# Patient Record
Sex: Female | Born: 1935 | Race: White | Hispanic: No
Health system: Southern US, Community
[De-identification: ages and names within clinical notes are randomized; demographics above are authoritative.]

---

## 2004-07-19 ENCOUNTER — Ambulatory Visit: Payer: Self-pay | Admitting: Gastroenterology

## 2004-08-09 ENCOUNTER — Emergency Department: Payer: Self-pay | Admitting: Emergency Medicine

## 2004-12-03 ENCOUNTER — Emergency Department: Payer: Self-pay | Admitting: General Practice

## 2004-12-28 ENCOUNTER — Ambulatory Visit: Payer: Self-pay | Admitting: Unknown Physician Specialty

## 2005-10-30 ENCOUNTER — Emergency Department: Payer: Self-pay | Admitting: Internal Medicine

## 2005-10-30 ENCOUNTER — Other Ambulatory Visit: Payer: Self-pay

## 2005-11-01 ENCOUNTER — Emergency Department: Payer: Self-pay | Admitting: Emergency Medicine

## 2006-01-05 ENCOUNTER — Ambulatory Visit: Payer: Self-pay | Admitting: Unknown Physician Specialty

## 2006-03-06 ENCOUNTER — Ambulatory Visit: Payer: Self-pay | Admitting: Unknown Physician Specialty

## 2007-02-14 ENCOUNTER — Ambulatory Visit: Payer: Self-pay | Admitting: Unknown Physician Specialty

## 2007-03-07 ENCOUNTER — Ambulatory Visit: Payer: Self-pay | Admitting: Unknown Physician Specialty

## 2007-04-02 ENCOUNTER — Inpatient Hospital Stay: Payer: Self-pay | Admitting: Specialist

## 2007-04-02 ENCOUNTER — Other Ambulatory Visit: Payer: Self-pay

## 2007-07-22 IMAGING — CR PELVIS - 1-2 VIEW
1 series · 1 of 1 positions shown · non-contrast
Comparison: none

REASON FOR EXAM: Fall. Rm 5
COMMENTS:

PROCEDURE:     DXR - DXR PELVIS AP ONLY  - October 30, 2005  [DATE]
RESULT:     AP view of the bony pelvis shows no fracture or other acute bony
abnormality.  There is narrowing of the L4-L5 intervertebral disk space
compatible with disk disease.

[view not recorded]
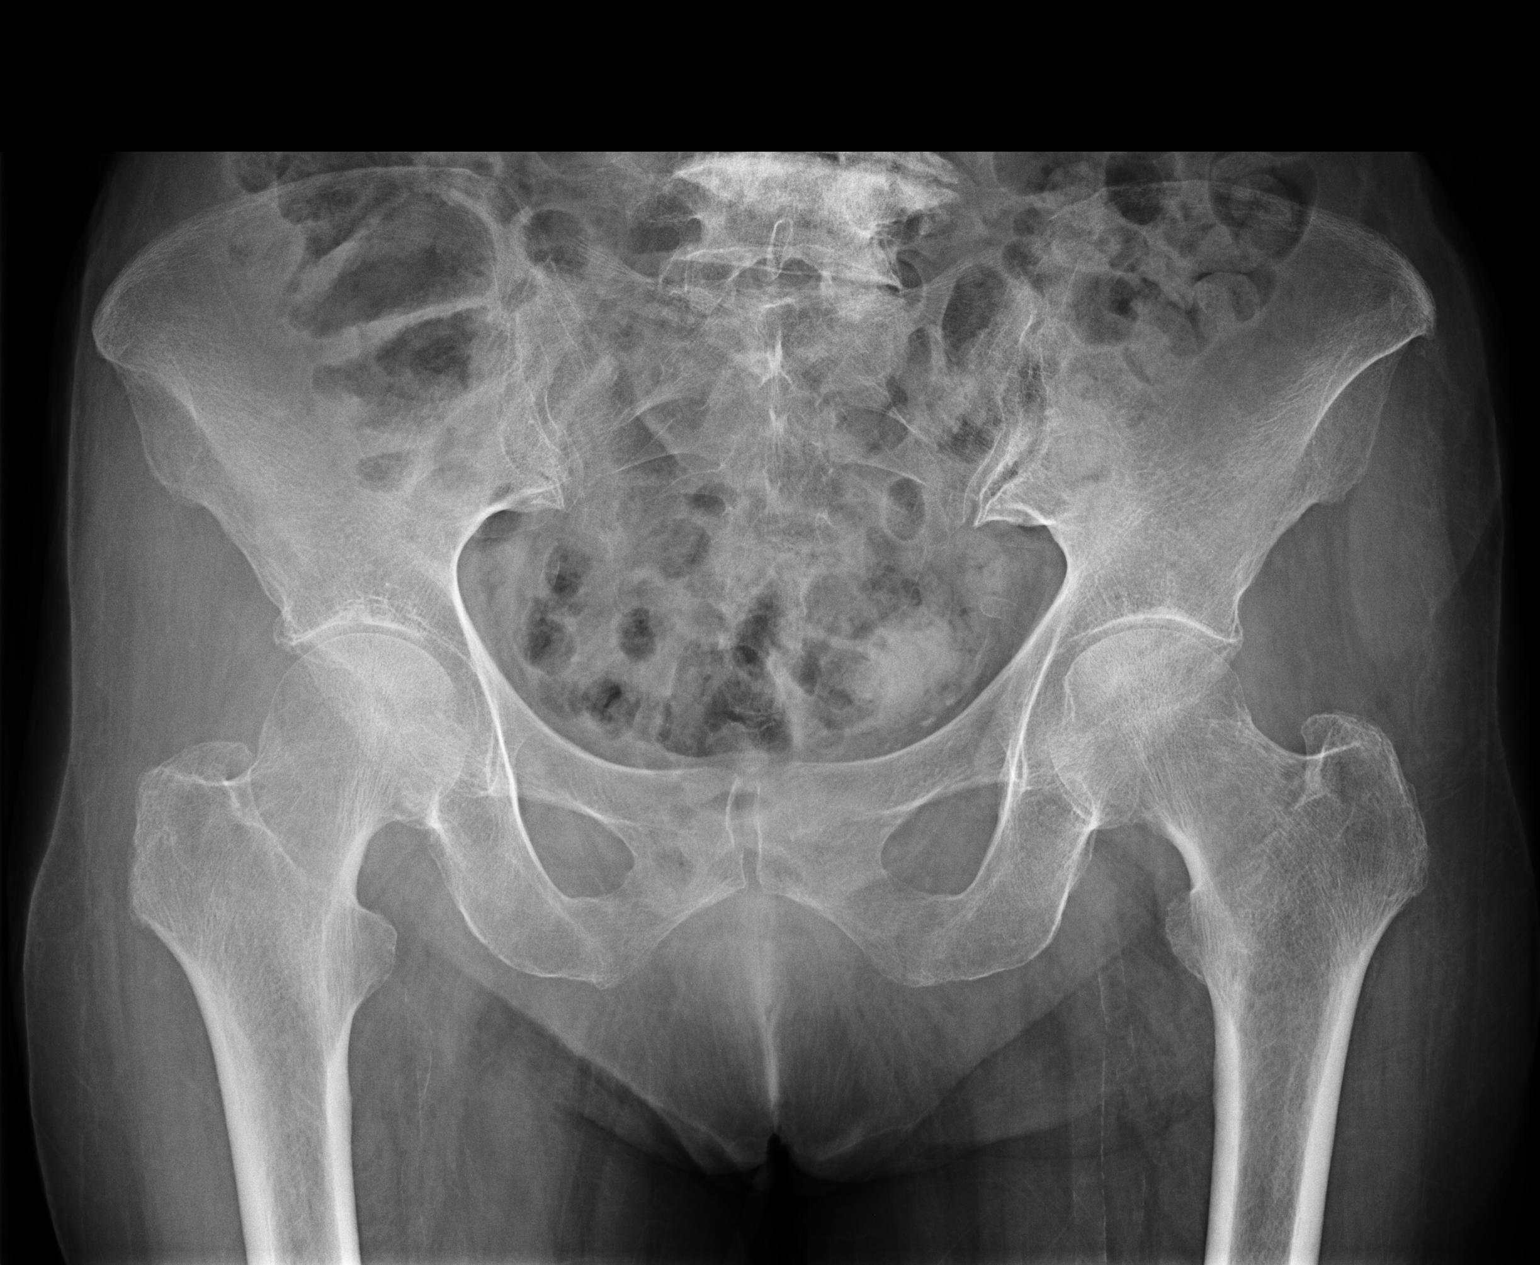

[1 of 1 positions shown; findings below may reference images not displayed]

IMPRESSION: No fracture is seen.

There are changes of degenerative disk disease of the lower lumbar spine.

## 2009-05-05 ENCOUNTER — Ambulatory Visit: Payer: Self-pay | Admitting: Gastroenterology

## 2012-10-12 ENCOUNTER — Observation Stay: Payer: Self-pay | Admitting: Internal Medicine

## 2012-10-12 LAB — CBC
HGB: 12.7 g/dL (ref 12.0–16.0)
Platelet: 198 10*3/uL (ref 150–440)
RBC: 3.91 10*6/uL (ref 3.80–5.20)
RDW: 15.4 % — ABNORMAL HIGH (ref 11.5–14.5)
WBC: 7 10*3/uL (ref 3.6–11.0)

## 2012-10-12 LAB — URINALYSIS, COMPLETE
Bilirubin,UR: NEGATIVE
Glucose,UR: NEGATIVE mg/dL (ref 0–75)
Nitrite: NEGATIVE
Protein: NEGATIVE
RBC,UR: 1 /HPF (ref 0–5)
Squamous Epithelial: <1

## 2012-10-12 LAB — COMPREHENSIVE METABOLIC PANEL
Albumin: 3.8 g/dL (ref 3.4–5.0)
BUN: 30 mg/dL — ABNORMAL HIGH (ref 7–18)
Calcium, Total: 9.3 mg/dL (ref 8.5–10.1)
Co2: 32 mmol/L (ref 21–32)
Sodium: 139 mmol/L (ref 136–145)

## 2012-10-13 LAB — CBC WITH DIFFERENTIAL/PLATELET
Basophil %: 1 %
HCT: 36.3 % (ref 35.0–47.0)
HGB: 12.3 g/dL (ref 12.0–16.0)
Monocyte #: 0.7 x10 3/mm (ref 0.2–0.9)
Neutrophil %: 56.8 %
WBC: 6.9 10*3/uL (ref 3.6–11.0)

## 2012-10-13 LAB — CK TOTAL AND CKMB (NOT AT ARMC)
CK, Total: 43 U/L (ref 21–215)
CK, Total: 42 U/L (ref 21–215)

## 2012-10-13 LAB — BASIC METABOLIC PANEL
BUN: 24 mg/dL — ABNORMAL HIGH (ref 7–18)
Calcium, Total: 9.2 mg/dL (ref 8.5–10.1)
Glucose: 110 mg/dL — ABNORMAL HIGH (ref 65–99)

## 2012-10-13 LAB — TROPONIN I
Troponin-I: 0.02 ng/mL
Troponin-I: 0.04 ng/mL

## 2012-10-19 DIAGNOSIS — N189 Chronic kidney disease, unspecified: Secondary | ICD-10-CM

## 2012-10-19 DIAGNOSIS — E785 Hyperlipidemia, unspecified: Secondary | ICD-10-CM

## 2012-10-19 DIAGNOSIS — I129 Hypertensive chronic kidney disease with stage 1 through stage 4 chronic kidney disease, or unspecified chronic kidney disease: Secondary | ICD-10-CM

## 2012-10-19 DIAGNOSIS — M6281 Muscle weakness (generalized): Secondary | ICD-10-CM

## 2012-10-19 DIAGNOSIS — E039 Hypothyroidism, unspecified: Secondary | ICD-10-CM

## 2012-11-01 LAB — LIPID PANEL: Cholesterol: 130 mg/dL (ref 0–200)

## 2012-11-14 ENCOUNTER — Encounter: Payer: Self-pay | Admitting: Family Medicine

## 2012-11-14 DIAGNOSIS — N189 Chronic kidney disease, unspecified: Secondary | ICD-10-CM

## 2012-11-14 DIAGNOSIS — E039 Hypothyroidism, unspecified: Secondary | ICD-10-CM

## 2012-11-14 DIAGNOSIS — F329 Major depressive disorder, single episode, unspecified: Secondary | ICD-10-CM

## 2012-11-14 DIAGNOSIS — Z66 Do not resuscitate: Secondary | ICD-10-CM | POA: Insufficient documentation

## 2012-11-14 DIAGNOSIS — G4700 Insomnia, unspecified: Secondary | ICD-10-CM

## 2012-11-30 DIAGNOSIS — I129 Hypertensive chronic kidney disease with stage 1 through stage 4 chronic kidney disease, or unspecified chronic kidney disease: Secondary | ICD-10-CM

## 2012-11-30 DIAGNOSIS — G4700 Insomnia, unspecified: Secondary | ICD-10-CM

## 2012-11-30 DIAGNOSIS — N189 Chronic kidney disease, unspecified: Secondary | ICD-10-CM

## 2012-11-30 DIAGNOSIS — E785 Hyperlipidemia, unspecified: Secondary | ICD-10-CM

## 2012-11-30 DIAGNOSIS — M810 Age-related osteoporosis without current pathological fracture: Secondary | ICD-10-CM

## 2013-01-02 ENCOUNTER — Ambulatory Visit: Payer: Self-pay | Admitting: Family Medicine

## 2013-01-07 DIAGNOSIS — L570 Actinic keratosis: Secondary | ICD-10-CM

## 2013-01-11 DIAGNOSIS — G4700 Insomnia, unspecified: Secondary | ICD-10-CM

## 2013-01-11 DIAGNOSIS — K5900 Constipation, unspecified: Secondary | ICD-10-CM

## 2013-01-11 DIAGNOSIS — M810 Age-related osteoporosis without current pathological fracture: Secondary | ICD-10-CM

## 2013-01-11 DIAGNOSIS — N189 Chronic kidney disease, unspecified: Secondary | ICD-10-CM

## 2013-01-30 DIAGNOSIS — H9209 Otalgia, unspecified ear: Secondary | ICD-10-CM

## 2013-02-20 DIAGNOSIS — H6120 Impacted cerumen, unspecified ear: Secondary | ICD-10-CM

## 2013-03-04 DIAGNOSIS — L02419 Cutaneous abscess of limb, unspecified: Secondary | ICD-10-CM

## 2013-03-04 DIAGNOSIS — L03119 Cellulitis of unspecified part of limb: Secondary | ICD-10-CM

## 2013-04-02 DIAGNOSIS — I872 Venous insufficiency (chronic) (peripheral): Secondary | ICD-10-CM

## 2013-05-15 DIAGNOSIS — F3289 Other specified depressive episodes: Secondary | ICD-10-CM

## 2013-05-15 DIAGNOSIS — G4700 Insomnia, unspecified: Secondary | ICD-10-CM

## 2013-05-15 DIAGNOSIS — F411 Generalized anxiety disorder: Secondary | ICD-10-CM

## 2013-05-15 DIAGNOSIS — E782 Mixed hyperlipidemia: Secondary | ICD-10-CM

## 2013-05-15 DIAGNOSIS — I872 Venous insufficiency (chronic) (peripheral): Secondary | ICD-10-CM

## 2013-05-15 DIAGNOSIS — I10 Essential (primary) hypertension: Secondary | ICD-10-CM

## 2013-05-15 DIAGNOSIS — F329 Major depressive disorder, single episode, unspecified: Secondary | ICD-10-CM

## 2013-05-20 DIAGNOSIS — L03119 Cellulitis of unspecified part of limb: Secondary | ICD-10-CM

## 2013-05-20 DIAGNOSIS — L02419 Cutaneous abscess of limb, unspecified: Secondary | ICD-10-CM

## 2013-06-19 DIAGNOSIS — D1779 Benign lipomatous neoplasm of other sites: Secondary | ICD-10-CM

## 2013-06-19 DIAGNOSIS — M25519 Pain in unspecified shoulder: Secondary | ICD-10-CM

## 2013-07-17 DIAGNOSIS — E876 Hypokalemia: Secondary | ICD-10-CM

## 2013-07-17 DIAGNOSIS — E039 Hypothyroidism, unspecified: Secondary | ICD-10-CM

## 2013-07-17 DIAGNOSIS — E785 Hyperlipidemia, unspecified: Secondary | ICD-10-CM

## 2013-07-17 DIAGNOSIS — N189 Chronic kidney disease, unspecified: Secondary | ICD-10-CM

## 2013-07-17 DIAGNOSIS — I129 Hypertensive chronic kidney disease with stage 1 through stage 4 chronic kidney disease, or unspecified chronic kidney disease: Secondary | ICD-10-CM

## 2013-09-06 DIAGNOSIS — H938X9 Other specified disorders of ear, unspecified ear: Secondary | ICD-10-CM

## 2013-09-17 DIAGNOSIS — E785 Hyperlipidemia, unspecified: Secondary | ICD-10-CM

## 2013-09-17 DIAGNOSIS — I10 Essential (primary) hypertension: Secondary | ICD-10-CM

## 2013-09-17 DIAGNOSIS — F329 Major depressive disorder, single episode, unspecified: Secondary | ICD-10-CM

## 2013-09-17 DIAGNOSIS — G4700 Insomnia, unspecified: Secondary | ICD-10-CM

## 2013-09-17 DIAGNOSIS — M1710 Unilateral primary osteoarthritis, unspecified knee: Secondary | ICD-10-CM

## 2013-09-17 DIAGNOSIS — F411 Generalized anxiety disorder: Secondary | ICD-10-CM

## 2013-09-17 DIAGNOSIS — IMO0002 Reserved for concepts with insufficient information to code with codable children: Secondary | ICD-10-CM

## 2013-09-17 DIAGNOSIS — F3289 Other specified depressive episodes: Secondary | ICD-10-CM

## 2013-11-18 DIAGNOSIS — F329 Major depressive disorder, single episode, unspecified: Secondary | ICD-10-CM

## 2013-11-18 DIAGNOSIS — F3289 Other specified depressive episodes: Secondary | ICD-10-CM

## 2013-11-18 DIAGNOSIS — F0150 Vascular dementia without behavioral disturbance: Secondary | ICD-10-CM

## 2013-11-18 DIAGNOSIS — I10 Essential (primary) hypertension: Secondary | ICD-10-CM

## 2013-11-18 DIAGNOSIS — F411 Generalized anxiety disorder: Secondary | ICD-10-CM

## 2013-11-18 DIAGNOSIS — E785 Hyperlipidemia, unspecified: Secondary | ICD-10-CM

## 2013-11-18 DIAGNOSIS — I872 Venous insufficiency (chronic) (peripheral): Secondary | ICD-10-CM

## 2014-01-17 DIAGNOSIS — I10 Essential (primary) hypertension: Secondary | ICD-10-CM

## 2014-01-17 DIAGNOSIS — G4700 Insomnia, unspecified: Secondary | ICD-10-CM

## 2014-01-17 DIAGNOSIS — F0280 Dementia in other diseases classified elsewhere without behavioral disturbance: Secondary | ICD-10-CM

## 2014-01-17 DIAGNOSIS — G309 Alzheimer's disease, unspecified: Secondary | ICD-10-CM

## 2014-01-17 DIAGNOSIS — E782 Mixed hyperlipidemia: Secondary | ICD-10-CM

## 2014-01-17 DIAGNOSIS — F411 Generalized anxiety disorder: Secondary | ICD-10-CM

## 2014-01-17 DIAGNOSIS — F329 Major depressive disorder, single episode, unspecified: Secondary | ICD-10-CM

## 2014-01-17 DIAGNOSIS — M1710 Unilateral primary osteoarthritis, unspecified knee: Secondary | ICD-10-CM

## 2014-01-17 DIAGNOSIS — I872 Venous insufficiency (chronic) (peripheral): Secondary | ICD-10-CM

## 2014-01-17 DIAGNOSIS — F3289 Other specified depressive episodes: Secondary | ICD-10-CM

## 2014-01-17 DIAGNOSIS — IMO0002 Reserved for concepts with insufficient information to code with codable children: Secondary | ICD-10-CM

## 2014-03-14 DIAGNOSIS — F0150 Vascular dementia without behavioral disturbance: Secondary | ICD-10-CM

## 2014-03-14 DIAGNOSIS — F418 Other specified anxiety disorders: Secondary | ICD-10-CM

## 2014-03-14 DIAGNOSIS — I872 Venous insufficiency (chronic) (peripheral): Secondary | ICD-10-CM

## 2014-03-14 DIAGNOSIS — I10 Essential (primary) hypertension: Secondary | ICD-10-CM

## 2014-05-11 ENCOUNTER — Emergency Department: Payer: Self-pay | Admitting: Internal Medicine

## 2014-05-11 LAB — COMPREHENSIVE METABOLIC PANEL
ALBUMIN: 3 g/dL — AB (ref 3.4–5.0)
ALK PHOS: 121 U/L — AB
AST: 14 U/L — AB (ref 15–37)
Anion Gap: 7 (ref 7–16)
BUN: 43 mg/dL — AB (ref 7–18)
Bilirubin,Total: 0.4 mg/dL (ref 0.2–1.0)
Calcium, Total: 9 mg/dL (ref 8.5–10.1)
Chloride: 103 mmol/L (ref 98–107)
Co2: 32 mmol/L (ref 21–32)
Creatinine: 1.34 mg/dL — ABNORMAL HIGH (ref 0.60–1.30)
EGFR (African American): 47 — ABNORMAL LOW
EGFR (Non-African Amer.): 39 — ABNORMAL LOW
GLUCOSE: 272 mg/dL — AB (ref 65–99)
Osmolality: 304 (ref 275–301)
POTASSIUM: 4 mmol/L (ref 3.5–5.1)
SGPT (ALT): 12 U/L — ABNORMAL LOW
SODIUM: 142 mmol/L (ref 136–145)
TOTAL PROTEIN: 7.1 g/dL (ref 6.4–8.2)

## 2014-05-11 LAB — CBC
HCT: 40.8 % (ref 35.0–47.0)
HGB: 13.4 g/dL (ref 12.0–16.0)
MCH: 32 pg (ref 26.0–34.0)
MCHC: 32.9 g/dL (ref 32.0–36.0)
MCV: 97 fL (ref 80–100)
Platelet: 222 10*3/uL (ref 150–440)
RBC: 4.19 10*6/uL (ref 3.80–5.20)
RDW: 15 % — AB (ref 11.5–14.5)
WBC: 9.3 10*3/uL (ref 3.6–11.0)

## 2014-05-11 LAB — TROPONIN I: TROPONIN-I: 0.02 ng/mL

## 2014-05-12 ENCOUNTER — Telehealth: Payer: Self-pay

## 2014-05-12 DIAGNOSIS — J181 Lobar pneumonia, unspecified organism: Secondary | ICD-10-CM

## 2014-05-12 NOTE — Telephone Encounter (Signed)
PLEASE NOTE: All timestamps contained within this report are represented as [place] Standard Time. CONFIDENTIALTY NOTICE: This fax transmission is intended only for the addressee. It contains information that is legally privileged, confidential or otherwise protected from use or disclosure. If you are not the intended recipient, you are strictly prohibited from reviewing, disclosing, copying using or disseminating any of this information or taking any action in reliance on or regarding this information. If you have received this fax in error, please notify us immediately by telephone so that we can arrange for its return to [place]. 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 Patient has had a cough for a few days and yesterday and this morning she has a low grade temp. She has some wheezing and she is coughing up green phlegm. PreDisposition Call Doctor Nurse Assessment Nurse: Amalia Hailey, RN, Lenna Sciara Date/Time Eilene Ghazi Time): 05/11/2014 9:41:45 AM Confirm and document reason for call. If symptomatic, describe symptoms. 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 Patient has had a cough for a few days and yesterday and this morning she  has a low grade temp. She has some wheezing and she is coughing up green phlegm Has the patient traveled out of the country within the last 30 days? ---Not Applicable Does the patient require triage? ---Yes Related visit to physician within the last 2 weeks? ---N/A Does the PT have any chronic conditions? (i.e. diabetes, asthma, etc.) ---Yes List chronic conditions. ---CHF, Guidelines Guideline Title Affirmed Question Affirmed Notes Nurse Date/Time (Eastern Time) Breathing Difficulty [1] Fever > 101 F (38.3 C) AND [2] age > 79 Evans, RN, Melissa 05/11/2014 9:44:41 AM Disp. Time Eilene Ghazi Time) Disposition Final User 05/11/2014 9:27:23 AM Send to Urgent Tyler Deis 05/11/2014 9:35:51 AM Attempt made - no message left Amalia Hailey, RN, Lenna Sciara 05/11/2014 9:59:54 AM Go to ED Now (or PCP triage) Amalia Hailey, RN, Melissa 05/11/2014 10:05:06 AM Send To RN Personal Amalia Hailey, RN, Melissa 05/11/2014 12:34:19 PM Called On-Call Provider Amalia Hailey, RN, Melissa PLEASE NOTE: All timestamps contained within this report are represented as [place] Standard Time. CONFIDENTIALTY NOTICE: This fax transmission is intended only for the addressee. It contains information that is legally privileged, confidential or otherwise protected from use or disclosure. If you are not the intended recipient, you are strictly prohibited from reviewing, disclosing, copying using or disseminating any of this information or taking any action in reliance on or regarding this information. If you have received this fax in error, please notify us immediately by telephone so that we can arrange for its return to [place]. Phone: [phone], Toll-Free: [phone], Fax: [phone] Page: 2 of 2 Call Id: [id number] [place]. Time Eilene Ghazi Time) Disposition Final User Reason: Called Dr. Garret Reddish and reported to him the patients symptoms of fever, cough , O2 sats, CXR report. 05/11/2014 12:41:42 PM Clinical Call Yes Amalia Hailey, RN,  Melissa Caller  Understands: Yes Disagree/Comply: Disagree Disagree/Comply Reason: Disagree with instructions Care Advice Given Per Guideline CARE ADVICE given per Breathing Difficulty (Adult) guideline. * IF PCP TRIAGE REQUIRED: You Huebsch need to be seen. Your doctor will want to talk with you to decide what's best. I'll page him now. If you haven't heard from the on-call doctor within 30 minutes, go directly to the [facility] at _____________ Hospital. After Care Instructions Given Call Event Type User Date / Time Description Comments User: Colin Ina, RN Date/Time Eilene Ghazi Time): 05/11/2014 10:03:11 AM Order CXR if requested by facility. Horton Chin nurse from [facility] requesting reports daughter does not want mother sent out of facility if at all possible. Caller given order for CXR and will call back results. User: Jinger Neighbors Date/Time Eilene Ghazi Time): 05/11/2014 12:17:41 PM Rip Harbour from [facility] is calling back because she has the chest xray results for the pt . notified the nurse and transferred the caller User: Colin Ina, RN Date/Time Eilene Ghazi Time): 05/11/2014 12:41:31 PM Dr. Garret Reddish called and reported the CXR report result, after standing order was given to the [place]. Report showed Cardiomegaley with mild interstitial prominence, maybe acute on chronic. Background:Emphysemic changes, Attention on f/u. Informed of pts. symptoms of cough with fever, decreased O2 sats and placed on oxygen @ 2L, triaged to ED. Informed DR. Hunter the Nurse reported daughter of the pt did not want her sent out of possible. Instructed to inform the NH nurse to send pt to ED for evaluation, if daughter refuses then have pt. seen by Dr. Silvio Pate tomorrow. Called the NH and spoke to Janifer Adie not available, informed of the directive given by Dr. Yong Channel. verbalized understood this information given. Referrals GO TO FACILITY UNDECIDED

## 2014-05-12 NOTE — Telephone Encounter (Signed)
Seen today at [facility] Daughter there On levaquin for probable RML pneumonia

## 2014-05-16 LAB — CULTURE, BLOOD (SINGLE)

## 2014-07-04 IMAGING — CT CT HEAD WITHOUT CONTRAST
1 series · 16 of 30 positions shown, 20 images · non-contrast
Comparison: none

REASON FOR EXAM: Weakness, syncopal event
COMMENTS:

PROCEDURE:     CT  - CT HEAD WITHOUT CONTRAST  - October 12, 2012  [DATE]
RESULT:     History: Weakness and syncope.
Comparison Study: No prior.

[Series 2: soft tissue · axial · 0.42mm/px · z∈[+496,+630]mm · 16 of 31 slices shown, 20 images]
[im 2/31  brain]
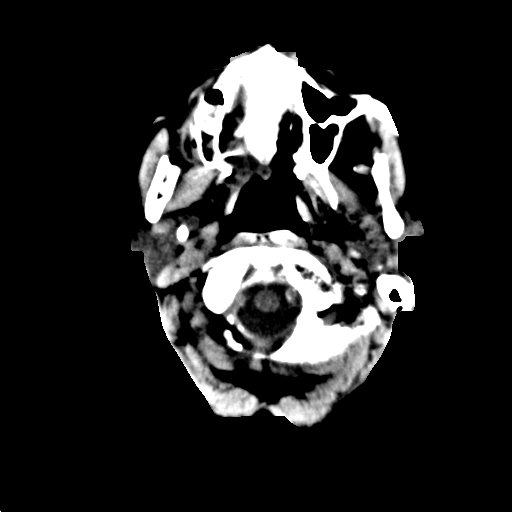
[im 2/31  bone]
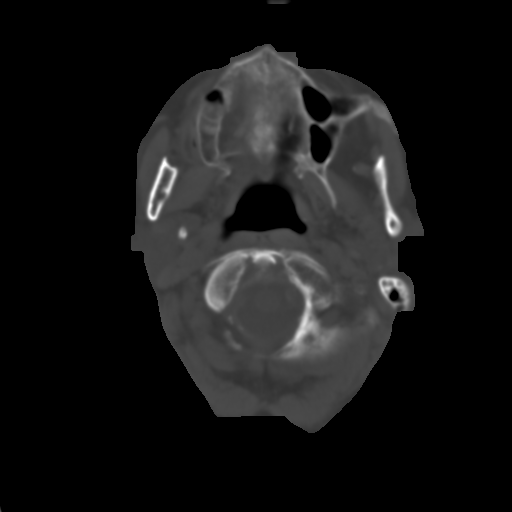
[im 4/31  brain]
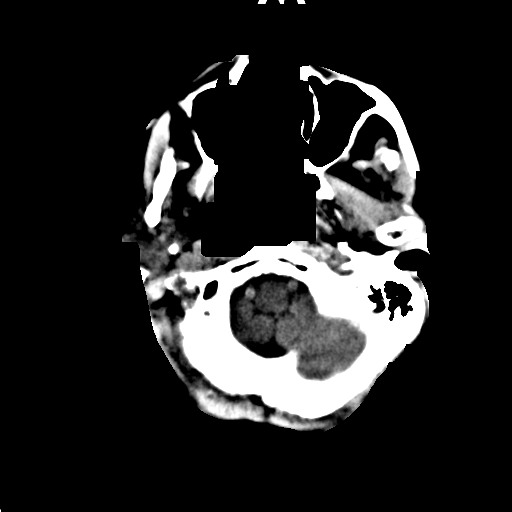
[im 6/31  brain]
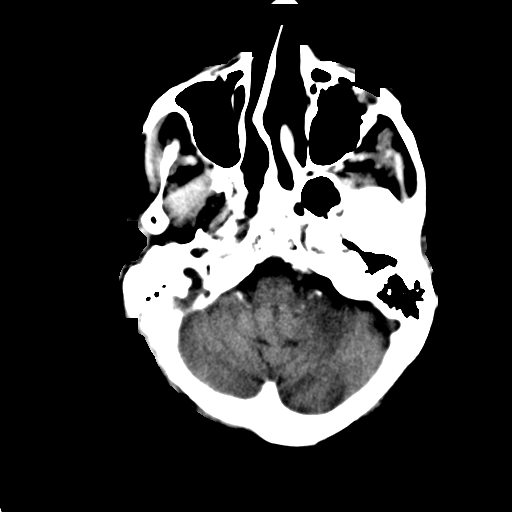
[im 8/31  brain]
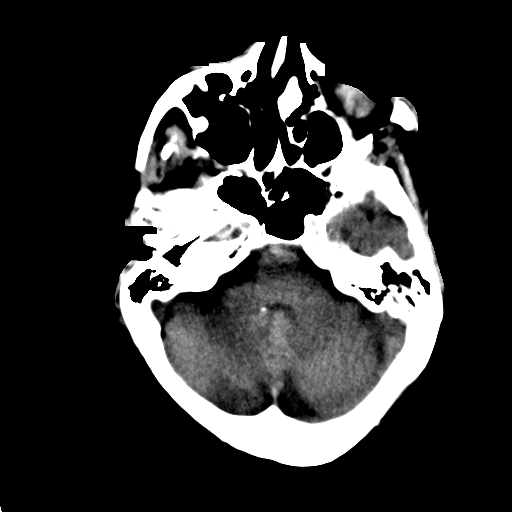
[im 9/31  brain]
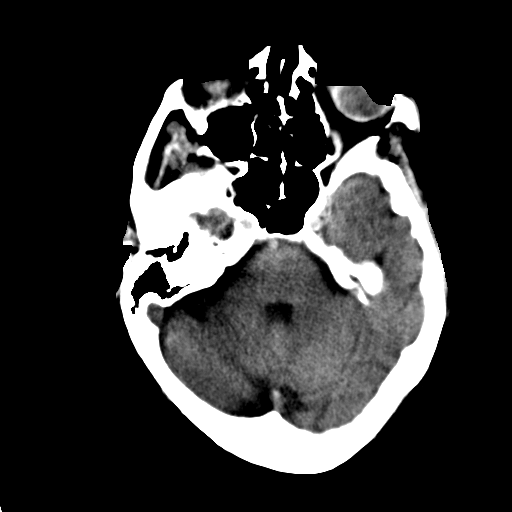
[im 9/31  bone]
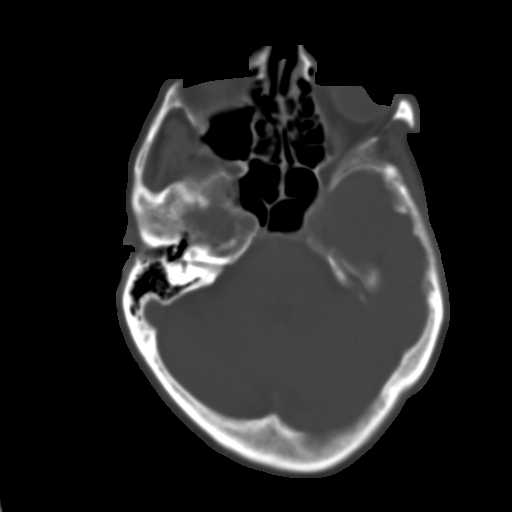
[im 11/31  brain]
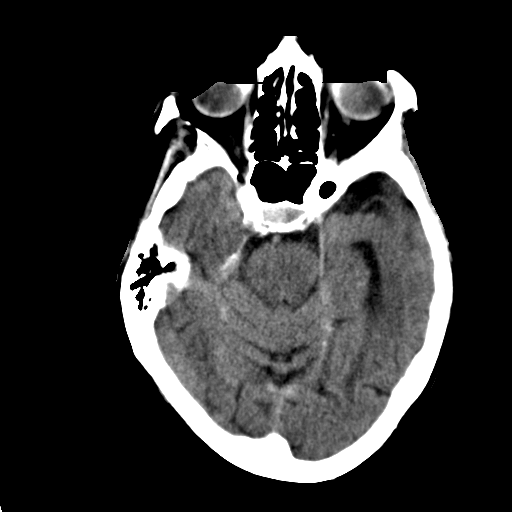
[im 13/31  brain]
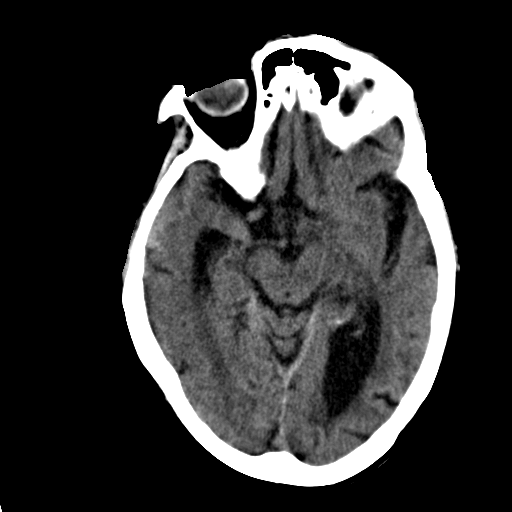
[im 15/31  brain]
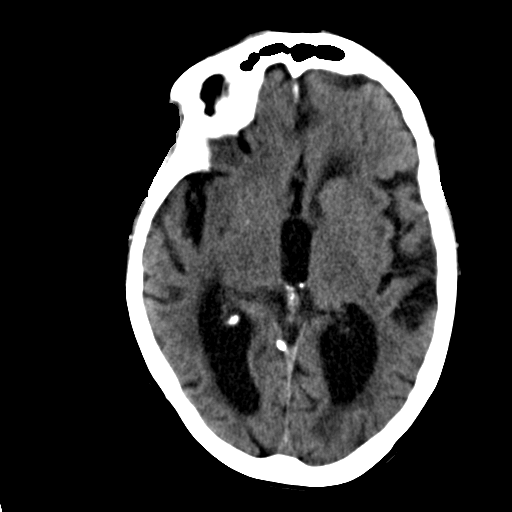
[im 16/31  brain]
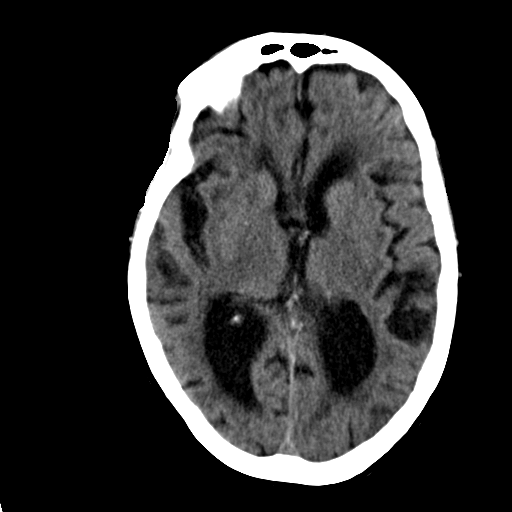
[im 16/31  bone]
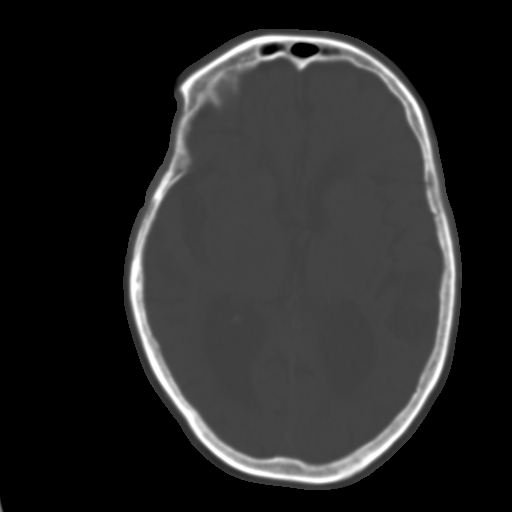
[im 18/31  brain]
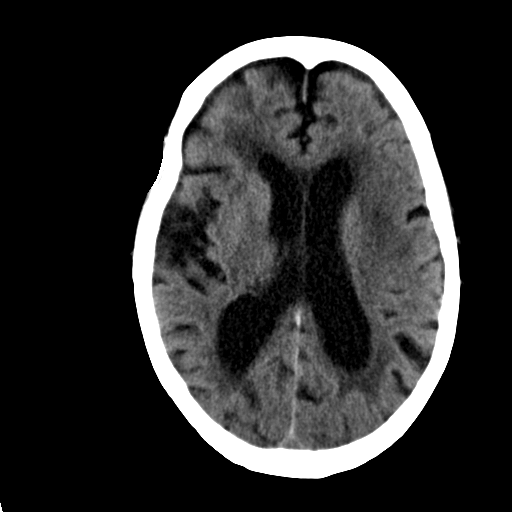
[im 20/31  brain]
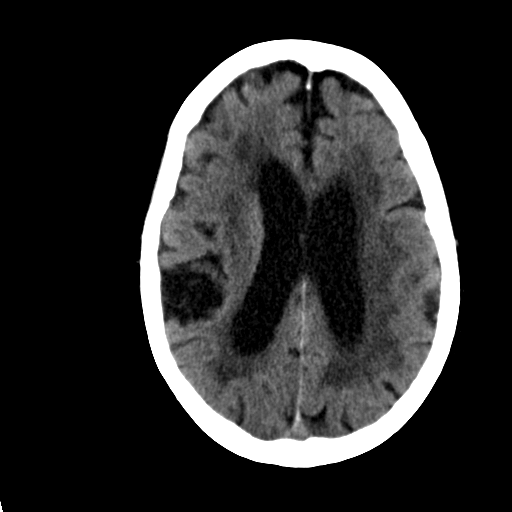
[im 22/31  brain]
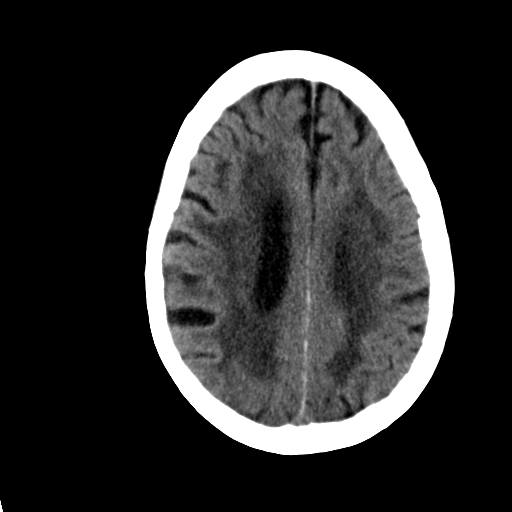
[im 23/31  brain]
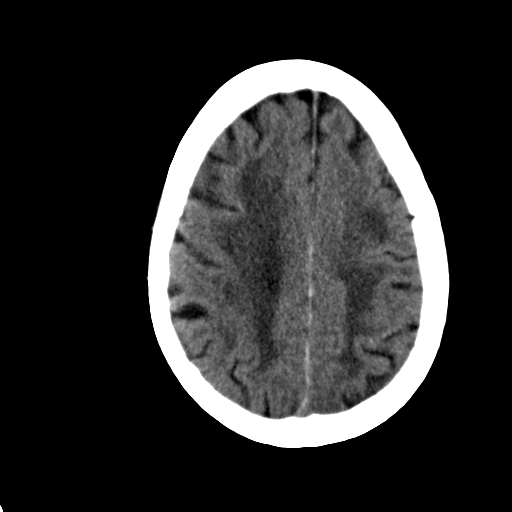
[im 23/31  bone]
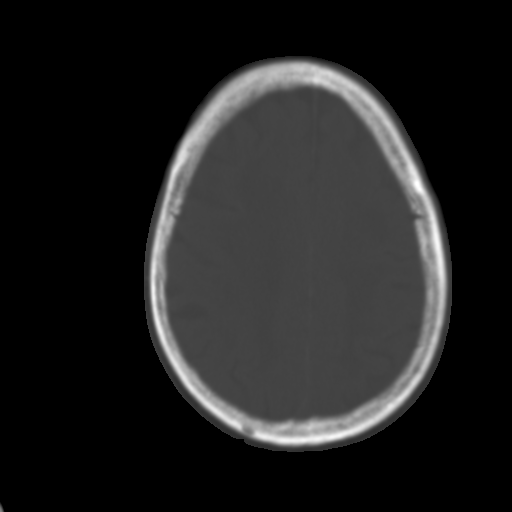
[im 25/31  brain]
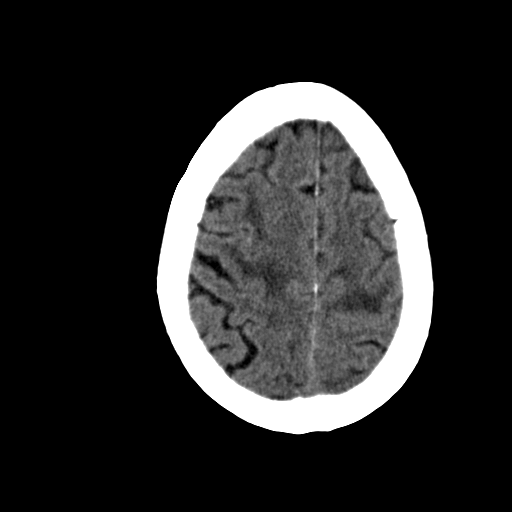
[im 27/31  brain]
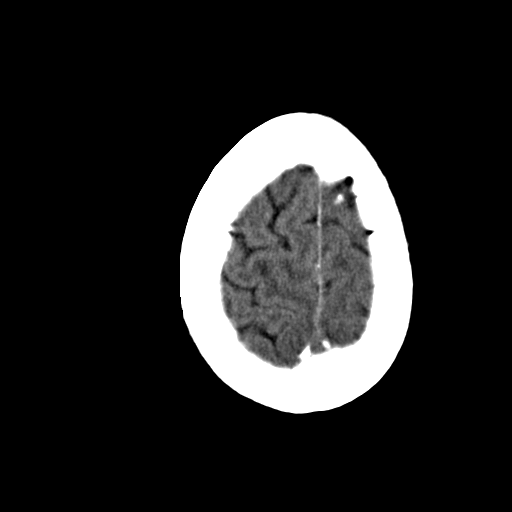
[im 29/31  brain]
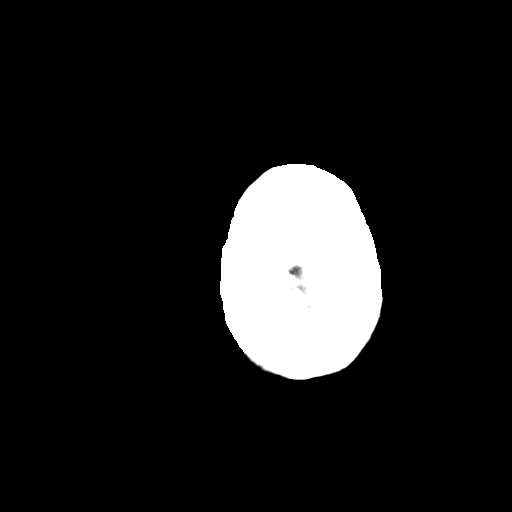

[16 of 30 positions shown; findings below may reference images not displayed]

FINDINGS: Standard nonenhanced CT obtained. Diffuse atrophy present. White
matter changes consistent with chronic ischemia present. Ventriculomegaly
noted consistent degree of atrophy. Mild mucosal thickening noted in the
ethmoidal sinuses and right maxillary sinus. No acute bony abnormality. Soft
tissue densities noted in the external auditory canals bilaterally. This may
represent cerumen, clinical correlation suggested.
IMPRESSION: No acute abnormality. Soft tissue densities in the external
auditory canals bilaterally.
This may represent cerumen, clinical correlation suggested.

## 2014-07-04 IMAGING — CR DG CHEST 2V
1 series · 2 of 2 positions shown · non-contrast
Comparison: none

REASON FOR EXAM: syncope
COMMENTS:   May transport without cardiac monitor

PROCEDURE:     DXR - DXR CHEST PA (OR AP) AND LATERAL  - October 12, 2012  [DATE]
RESULT:     Mild atelectasis versus infiltrate left lung base with small
left pleural effusion. Right lung is clear. Heart size normal.

[Series 1: ap · 0.17mm/px · 2 of 2 slices shown]
[im 1/2]
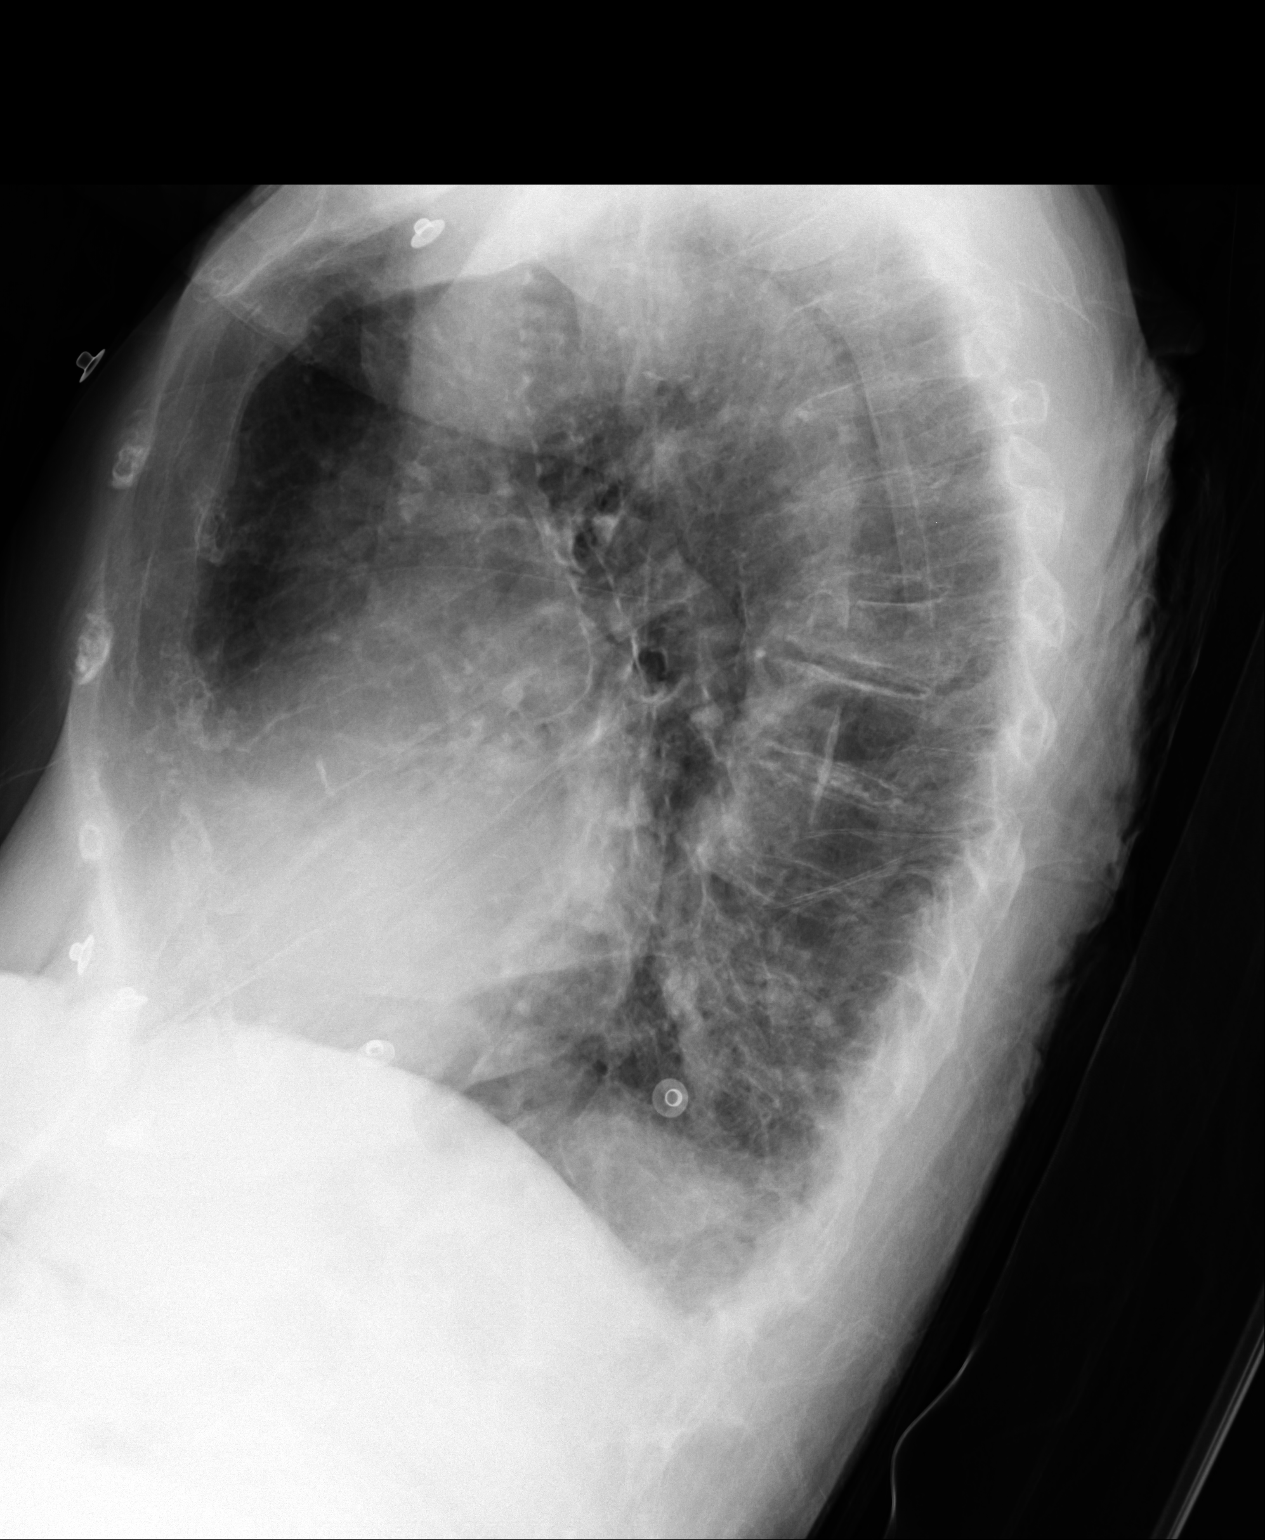
[im 2/2]
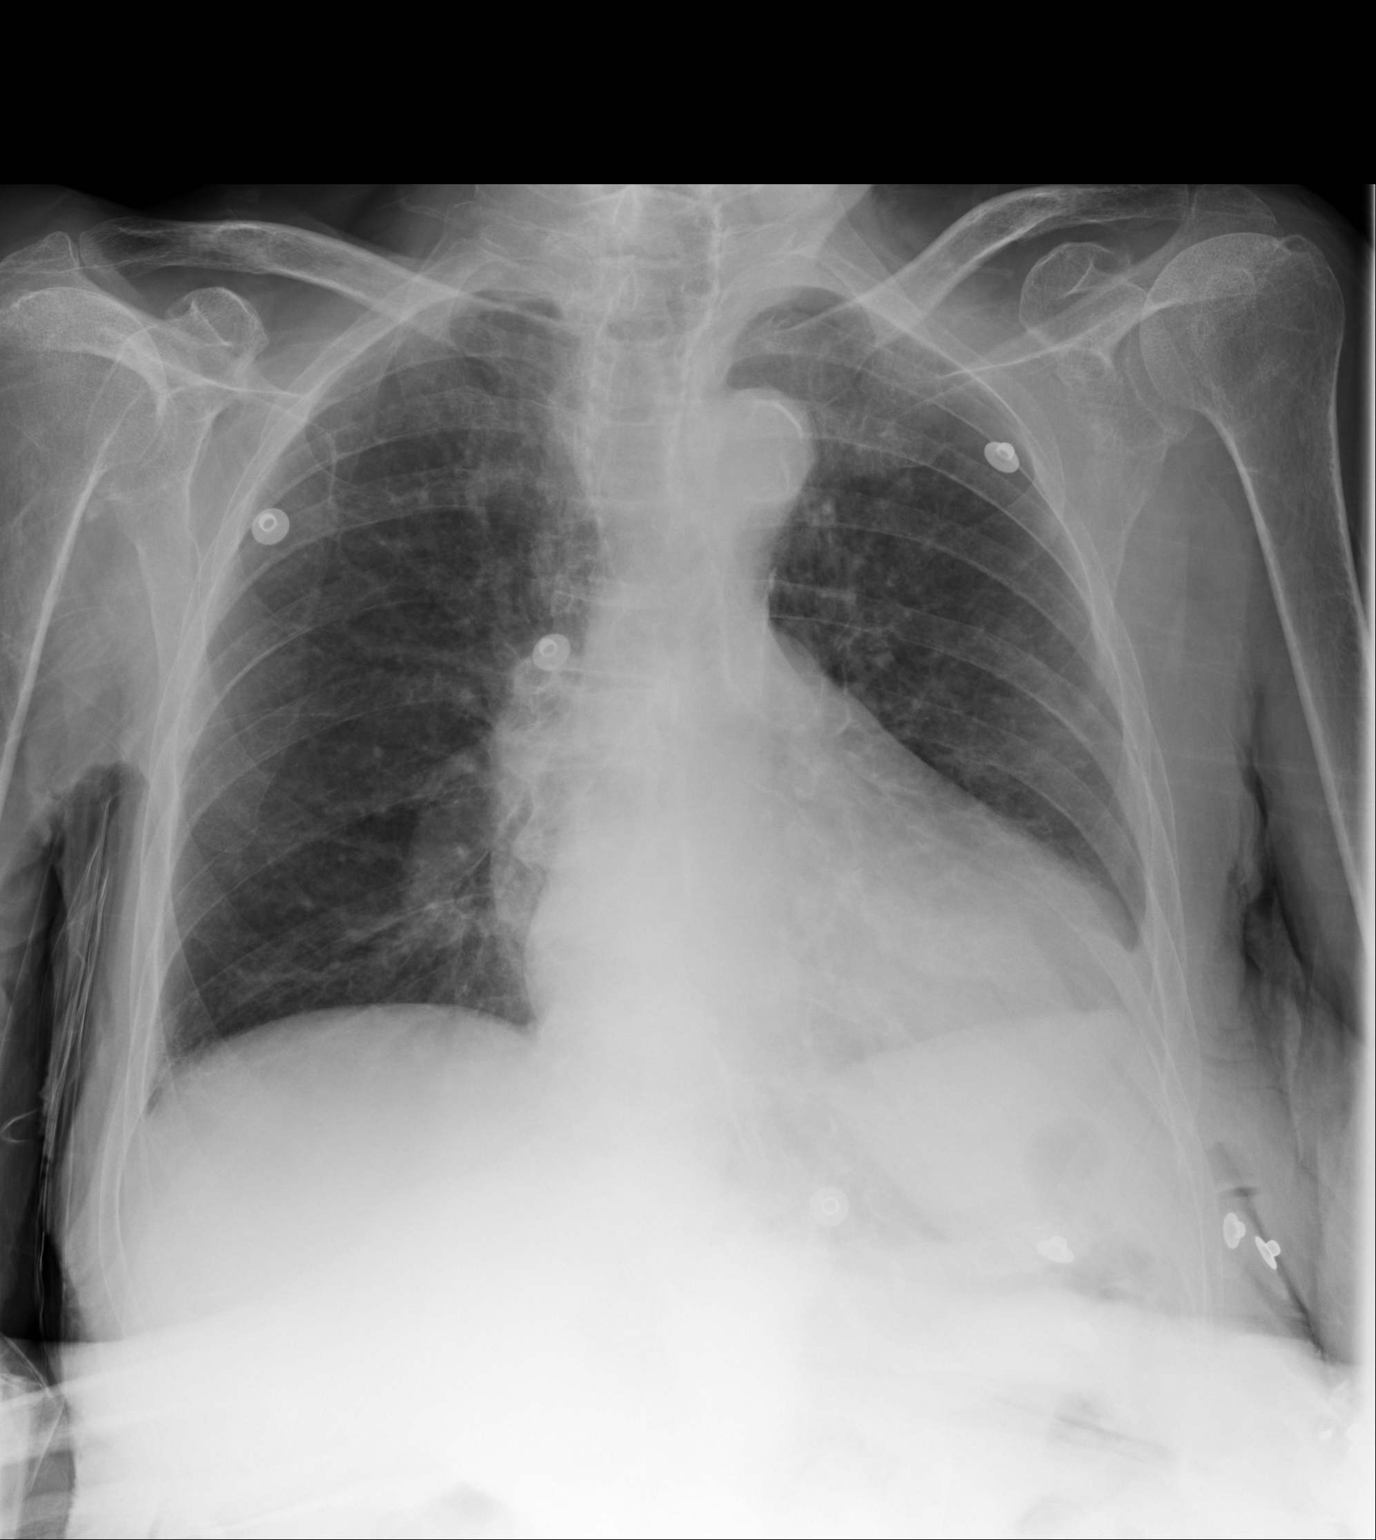

[2 of 2 positions shown; findings below may reference images not displayed]

IMPRESSION: Mild atelectasis versus infiltrate left lung base with
small left pleural effusion.

## 2014-07-09 DIAGNOSIS — B351 Tinea unguium: Secondary | ICD-10-CM | POA: Diagnosis not present

## 2014-07-16 DIAGNOSIS — H6123 Impacted cerumen, bilateral: Secondary | ICD-10-CM

## 2014-07-16 DIAGNOSIS — I872 Venous insufficiency (chronic) (peripheral): Secondary | ICD-10-CM

## 2014-07-16 DIAGNOSIS — F323 Major depressive disorder, single episode, severe with psychotic features: Secondary | ICD-10-CM

## 2014-07-16 DIAGNOSIS — F419 Anxiety disorder, unspecified: Secondary | ICD-10-CM

## 2014-07-16 DIAGNOSIS — I10 Essential (primary) hypertension: Secondary | ICD-10-CM

## 2014-07-16 DIAGNOSIS — F0150 Vascular dementia without behavioral disturbance: Secondary | ICD-10-CM

## 2014-07-16 DIAGNOSIS — M1990 Unspecified osteoarthritis, unspecified site: Secondary | ICD-10-CM

## 2014-08-14 ENCOUNTER — Emergency Department
Admit: 2014-08-14 | Disposition: A | Discharge status: Skilled Nursing Facility | Payer: Self-pay | Admitting: Emergency Medicine

## 2014-08-15 ENCOUNTER — Telehealth: Payer: Self-pay

## 2014-08-15 NOTE — Telephone Encounter (Signed)
In pts chart appears pt was seen at [facility] ED on 08/14/14.

## 2014-08-15 NOTE — Telephone Encounter (Signed)
PLEASE NOTE: All timestamps contained within this report are represented as [place] Standard Time. CONFIDENTIALTY NOTICE: This fax transmission is intended only for the addressee. It contains information that is legally privileged, confidential or otherwise protected from use or disclosure. If you are not the intended recipient, you are strictly prohibited from reviewing, disclosing, copying using or disseminating any of this information or taking any action in reliance on or regarding this information. If you have received this fax in error, please notify us immediately by telephone so that we can arrange for its return to [place]. Phone: [phone], Toll-Free: [phone], Fax: [phone] Page: 1 of 1 Call Id: [id number] [place] Patient Name: Heather Silva Gender: Female DOB: [birth year] Age: 81 Y 36 M Return Phone Number: [phone] (Primary) Address: [place]/State/Zip: [place] [address] Client [place] Night - Client Client Site [place] Physician Viviana Simpler Contact Type Call Call Type Page Only Caller Name Lelon Frohlich [phone] Relationship To Patient Provider Is this call to report lab results? No Return Phone Number [phone] (Primary) Initial Comment Caller states this is Ann at [employer] PT has a skin tare that is a meter and a half deep she needs to go to the hospital . need to get a order from the dt to get her to the hospital Nurse Assessment Guidelines Guideline Title Affirmed Question Affirmed Notes Nurse Date/Time ([place] Time) [place]. Time Eilene Ghazi Time) Disposition Final User 08/14/2014 7:18:18 PM Send to [place], [place] 08/14/2014 7:18:23 PM Send to [place], [place] 08/14/2014 7:25:15 PM Called On-Call Provider Renato Shin 08/14/2014 7:25:42 PM Page Completed Yes Renato Shin After Care Instructions Given Call Event Type User Date / Time Description Paging DoctorName Phone DateTime Result/Outcome Message Type Notes Vertell Novak [phone] 08/14/2014 7:25:15 PM Called On Call Provider - Reached Doctor Paged Connected on call with caller Vertell Novak 08/14/2014 7:25:28 PM Spoke with On Call - General Message Result Connected on call with caller

## 2014-08-16 NOTE — Telephone Encounter (Signed)
We will follow up at [facility]

## 2014-08-22 NOTE — H&P (Signed)
PATIENT NAME:  Heather Silva, Heather Silva MR#:  [id number] DATE OF BIRTH:  01-May-1918  DATE OF ADMISSION:  10/12/2012  ADMITTING PHYSICIAN: Gladstone Lighter, MD  PRIMARY CARE PHYSICIAN: Kirstie Peri. Caryn Section, MD  CHIEF COMPLAINT: Syncope.   HISTORY OF PRESENT ILLNESS: Heather Silva is a 81 year old, very pleasant Caucasian female with past medical history significant for hypertension, hyperlipidemia, degenerative arthritis with poor mobility using a walker at baseline, was brought by daughter secondary to a syncopal episode. The patient is hard of hearing and most of the history is obtained by daughter at bedside. According to the daughter, the patient was outside with her daughter today and she wanted to use the bathroom, so daughter was pushing her on the wheelchair to the bathroom. After she reached the bathroom, the patient wanted to stand up to urinate. As soon as she stood up, she went down and passed out for about less than a few minutes and woke up immediately. No seizure activity was noted. The patient denied any chest pain prior to that episode or after the episode. No postictal confusion. No nausea, vomiting, fevers. The patient had a couple of episodes of diarrhea the last few days ago after she was started on a bowel regimen for constipation. She appears slightly dehydrated on exam, otherwise no significant findings. Her EKG was normal as well in the ED.   PAST MEDICAL HISTORY: 1.  Hypertension.  2.  Hyperlipidemia.  3.  History of frequent UTIs.  4.  Degenerative arthritis of the lumbar spine and chronic back pain.   PAST SURGICAL HISTORY: Lower back surgery.   ALLERGIES TO MEDICATIONS:  1.  ALPHAGAN EYE DROPS.  2.  AUGMENTIN.  3.  FELODIPINE.  4.  PLENDIL.  5.  ULTRAM. 6.  LATEX.  CURRENT HOME MEDICATIONS:  1.  Clonidine 0.1 mg p.o. daily.  2.  Etodolac 1 tablet p.o. b.i.d.  3.  Hydralazine 25 mg p.o. b.i.d.  4.  HCTZ/losartan 25/100 mg 1 tablet p.o. daily.  5.  Klor-Con 20 mEq p.o. daily. 6.   Latanoprost ophthalmic solution 0.005% 1 drop both eyes at bedtime. 7.  Pravastatin 40 mg p.o. daily.  8.  Temazepam 15 mg at bedtime.  9.  Timolol 0.5% ophthalmic solution once a day in the morning, both eyes.   SOCIAL HISTORY: Currently living at home by herself, and her daughter moved in about 6 weeks ago due to ambulation by the patient and risk of falls lately. The patient is in the process of being transferred over to [facility] skilled nursing facility next week. She already has a bed on hold. No smoking or alcohol use.   FAMILY HISTORY: Mom had stroke and father died from complications of lung cancer.   REVIEW OF SYSTEMS:    CONSTITUTIONAL: No fever, fatigue or weakness.  EYES: No blurred vision, double vision, glaucoma or cataracts.  EARS, NOSE, THROAT: No tinnitus, ear pain. Positive for extreme hearing loss. No epistaxis or discharge.  RESPIRATORY: No cough, wheeze, hemoptysis or COPD.  CARDIOVASCULAR: No chest pain, orthopnea, edema, arrhythmia or palpitations. Positive for syncope.  GASTROINTESTINAL: No nausea, vomiting. Diarrhea was present last week, none now. No abdominal pain, hematemesis or melena.  GENITOURINARY: No dysuria, hematuria, renal calculus, frequency or incontinence.  ENDOCRINE: No polyuria, nocturia, thyroid problems, heat or cold intolerance.  HEMATOLOGIC: No anemia, easy bruising or bleeding.  SKIN: No acne, rash or lesions.  MUSCULOSKELETAL: Positive for low back pain and also arthritis, especially in the legs.  NEUROLOGICAL : No  numbness, weakness, CVA, TIA or seizures.  PSYCHOLOGICAL: No anxiety, insomnia or depression.   PHYSICAL EXAMINATION: VITAL SIGNS: Temperature 97 degrees Fahrenheit, pulse 60, respirations 18, blood pressure 125/52, pulse oximetry 96% on room air.  GENERAL: Well-built, well-nourished elderly female lying in bed, not in any acute distress.  HEENT: Normocephalic, atraumatic. Pupils equal, round, reacting to light. Anicteric sclerae.  Extraocular movements are intact. Oropharynx clear without erythema, mass or exudates. Very dry lips and also mucous membranes. Nasopharynx is clear with no lesions or discharge. Ears: Bilateral hearing aids are in place. No external lesions or discharge present.  NECK: Supple. No thyromegaly, JVD or carotid bruits. No lymphadenopathy. Normal range of motion of neck without any pain.  LUNGS: Clear to auscultation bilaterally. No wheeze or crackles. No use of accessory muscles for breathing.  CARDIOVASCULAR: S1, S2, regular rate and rhythm; III/VI  systolic murmur is present. No rubs or gallops.  ABDOMEN: Soft, nontender, nondistended. No hepatosplenomegaly. Normal bowel sounds.  EXTREMITIES: No pedal edema. No clubbing or cyanosis. Tender to touch extremities. She has 1+ dorsalis pedis pulses palpable bilaterally.  SKIN: No acne, rash or lesions.  LYMPHATICS: No cervical lymphadenopathy.  NEUROLOGIC: Cranial nerves seem to be intact, II through XII. Motor function is 5/5 both upper extremities and 4/5 bilateral lower extremities. Sensation is intact. Normal cerebellar  function tests. PSYCHOLOGICAL: The patient is awake, alert, oriented x 3.   LABORATORY, DIAGNOSTIC AND RADIOLOGICAL DATA: WBC 7.0, hemoglobin 12.7, hematocrit 37.6, platelet count 198. Sodium 139, potassium 3.7, chloride 101, bicarbonate 32, BUN 30, creatinine 0.99, glucose 148 and calcium of 9.3. ALT 19, AST 21, alkaline phosphatase 70, total bilirubin 0.4 and albumin of 3.8. CK 44, CK-MB 1.0, troponin less than 0.02. Urinalysis with 1+ leukocyte esterase, few WBCs and trace bacteria.   Chest x-ray with mild atelectasis versus left lung base infiltrate with small left pleural effusion. CT of the head showing no acute abnormalities are present. Soft tissue densities in both external auditory canals, likely related to the hearing aids.   EKG showing junctional rhythm, left anterior fascicular block, heart rate of 60.   ASSESSMENT AND  PLAN: A 81 year old female with history of hypertension and hyperlipidemia and degenerative arthritis who had a syncopal episode.  1.  Syncope, likely vasovagal: The patient appears slightly dehydrated on exam, so admit under observation and give intravenous fluids. Carotid Dopplers in the morning. CT of the head did not show any acute abnormalities.  2.  Hypertension: Continue her home medications for now.  3.  Hyperlipidemia: Continue her statin.  4.  Possible left lower lobe infiltrate as seen on chest x-ray: No white count, no fevers, no cough or any symptoms. Will hold off on antibiotics at this time. Probably atelectasis. Continue to monitor.  5.  Gastrointestinal and deep venous thrombosis prophylaxis: On Protonix and Lovenox.   CODE STATUS: Full code.   TIME SPENT ON ADMISSION: 50 minutes.   ____________________________ Gladstone Lighter, MD [id number] D: 10/12/2012 20:13:55 ET T: 10/12/2012 20:32:47 ET JOB#: [id number]  cc: Gladstone Lighter, MD, <Dictator> Kirstie Peri. Caryn Section, MD Gladstone Lighter MD ELECTRONICALLY SIGNED 10/22/2012 13:52

## 2014-08-22 NOTE — Discharge Summary (Signed)
PATIENT NAME:  Heather Silva, Heather Silva MR#:  [id number] DATE OF BIRTH:  Jan 09, 1918  DATE OF ADMISSION:  10/12/2012 DATE OF DISCHARGE:  10/13/2012  FINAL DIAGNOSES: 1.  Urinary tract infection.  2.  Syncopal episode.  3.  Hypertension.  4.  Hyperlipidemia.   CONDITION ON DISCHARGE:  Stable.   CODE STATUS:  FULL CODE.   MEDICATIONS ON DISCHARGE: 1.  Timolol ophthalmic solution one drop each eye.  2.  Hydrochlorothiazide and losartan combination tablet 25 mg plus 100 mg, take 1 tablet once a day.  3.  Hydralazine 50 mg oral tablet, take 0.5 tablet 2 times a day.  4.  Clonidine 0.1 mg tablet once a day.  5.  KCl 20 mEq once a day.  6.  Pravastatin 40 mg once a day.  7.  Temazepam 15 mg oral capsule once a day.  8.  Levofloxacin 250 mg oral tablet once a day for 4 days.   DIET ON DISCHARGE:  Low sodium.   DIET CONSISTENCY:  Regular.   ACTIVITY:  As tolerated.   TIMEFRAME TO FOLLOW-UP:  One to two weeks with Dr. Lelon Huh, [facility].   HISTORY OF PRESENT ILLNESS:  A 81 year old Caucasian female with past medical history of hypertension, hyperlipidemia, degenerative arthritis.  According to the daughter the patient was outside with her daughter and she wanted to use bathroom.  Daughter was pushing her wheelchair and she went down and passed out for less than a few minutes.  No seizure activity was noted.  No nausea or vomiting and no chest pain or palpitations before or after the episode.    HOSPITAL COURSE AND STAY:  1.  On presentation, by EMS, her blood pressure was 104/60 and the patient did complain of on and off loose stool for the last few days and also had some urinary symptoms, so maybe that was a combination of dehydration and UTI which caused her syncopal episode.  After IV fluid replacement she felt good.  Carotid Doppler was done and CT head was done.  They both were negative.  2.  UTI.  UA was mildly positive.  Daughter says that she had some suspicion about UTI  due to her change in the urinary habits and we started treatment with Levaquin.  3.  Hypotension possibly at the time of episode, but she remained blood pressure normal on the higher side in hospital.  4.  Generalized weakness.  Daughter was already working out to get rehab for the patient and have long term care facility placement, so we discussed with her not to get physical therapy here as she already have arrangement and she agreed with that plan and we discharged her home.  5.  Hyperlipidemia.  We will continue statin.  6.  Left lower lobe atelectasis, did not look like infection, but she was on Levaquin anyway and we discharged with that.   IMPORTANT LABORATORY RESULTS IN THE HOSPITAL:  BUN 30, creatinine 0.99, calcium 9.3.  All 3 troponin less than 0.02.  WBC 6.9, hemoglobin 12.3, platelet count 192.  Urinalysis, 5 WBC, 1+ leukocyte esterase.   Total time spent in this discharge 35 minutes.    ____________________________ Ceasar Lund Anselm Jungling, MD vgv:[id number] D: 10/17/2012 23:00:17 ET T: 10/18/2012 03:52:56 ET JOB#: [id number]  cc: Ceasar Lund. Anselm Jungling, MD, <Dictator> Kirstie Peri. Caryn Section, MD Vaughan Basta MD ELECTRONICALLY SIGNED 11/06/2012 14:17

## 2014-09-02 DIAGNOSIS — E119 Type 2 diabetes mellitus without complications: Secondary | ICD-10-CM | POA: Diagnosis not present

## 2014-09-15 DIAGNOSIS — F0150 Vascular dementia without behavioral disturbance: Secondary | ICD-10-CM

## 2014-09-15 DIAGNOSIS — E119 Type 2 diabetes mellitus without complications: Secondary | ICD-10-CM

## 2014-09-15 DIAGNOSIS — F411 Generalized anxiety disorder: Secondary | ICD-10-CM | POA: Diagnosis not present

## 2014-09-24 DIAGNOSIS — B351 Tinea unguium: Secondary | ICD-10-CM | POA: Diagnosis not present

## 2014-10-03 DIAGNOSIS — M7981 Nontraumatic hematoma of soft tissue: Secondary | ICD-10-CM

## 2014-10-23 DIAGNOSIS — M6281 Muscle weakness (generalized): Secondary | ICD-10-CM | POA: Diagnosis not present

## 2014-11-19 DIAGNOSIS — I872 Venous insufficiency (chronic) (peripheral): Secondary | ICD-10-CM | POA: Diagnosis not present

## 2014-11-19 DIAGNOSIS — F320 Major depressive disorder, single episode, mild: Secondary | ICD-10-CM

## 2014-11-19 DIAGNOSIS — E1165 Type 2 diabetes mellitus with hyperglycemia: Secondary | ICD-10-CM

## 2014-11-19 DIAGNOSIS — I10 Essential (primary) hypertension: Secondary | ICD-10-CM | POA: Diagnosis not present

## 2014-11-19 DIAGNOSIS — F0150 Vascular dementia without behavioral disturbance: Secondary | ICD-10-CM

## 2014-11-19 DIAGNOSIS — M7981 Nontraumatic hematoma of soft tissue: Secondary | ICD-10-CM | POA: Diagnosis not present

## 2014-11-19 DIAGNOSIS — F419 Anxiety disorder, unspecified: Secondary | ICD-10-CM

## 2015-01-12 DIAGNOSIS — F0151 Vascular dementia with behavioral disturbance: Secondary | ICD-10-CM

## 2015-01-12 DIAGNOSIS — F411 Generalized anxiety disorder: Secondary | ICD-10-CM

## 2015-01-12 DIAGNOSIS — E119 Type 2 diabetes mellitus without complications: Secondary | ICD-10-CM | POA: Diagnosis not present

## 2015-01-12 DIAGNOSIS — I872 Venous insufficiency (chronic) (peripheral): Secondary | ICD-10-CM | POA: Diagnosis not present

## 2015-01-12 DIAGNOSIS — F329 Major depressive disorder, single episode, unspecified: Secondary | ICD-10-CM

## 2015-01-14 DIAGNOSIS — S60512A Abrasion of left hand, initial encounter: Secondary | ICD-10-CM | POA: Diagnosis not present

## 2015-04-02 DEATH — deceased
